# Patient Record
Sex: Female | Born: 2000 | Race: Black or African American | Hispanic: No | Marital: Single | Smoking: Never smoker
Health system: Southern US, Community
[De-identification: ages and names within clinical notes are randomized; demographics above are authoritative.]

## PROBLEM LIST (undated history)

## (undated) DIAGNOSIS — F909 Attention-deficit hyperactivity disorder, unspecified type: Secondary | ICD-10-CM

## (undated) HISTORY — PX: OTHER SURGICAL HISTORY: SHX169

---
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
## 2022-01-14 IMAGING — CR DG SACRUM/COCCYX 2+V
3 series · 3 of 3 positions shown · non-contrast
Comparison: None.

CLINICAL DATA: 19-year-old female status post fall 4 days ago with
continued tailbone pain.

EXAM:
SACRUM AND COCCYX - 2+ VIEW

[t sacrum ap]
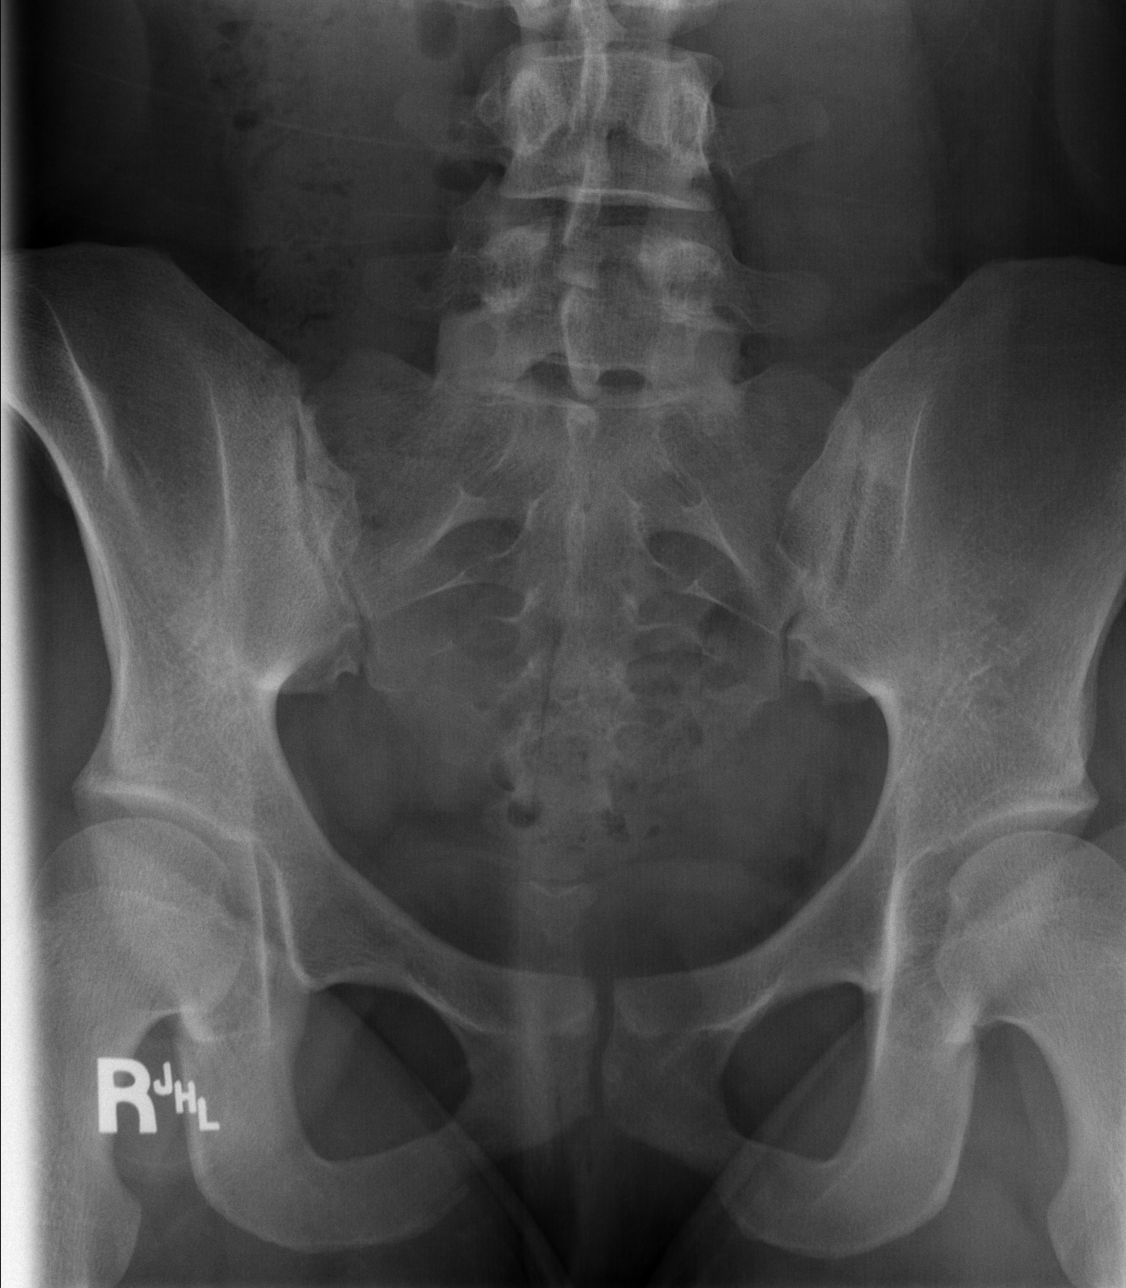

[t coccyx ap]
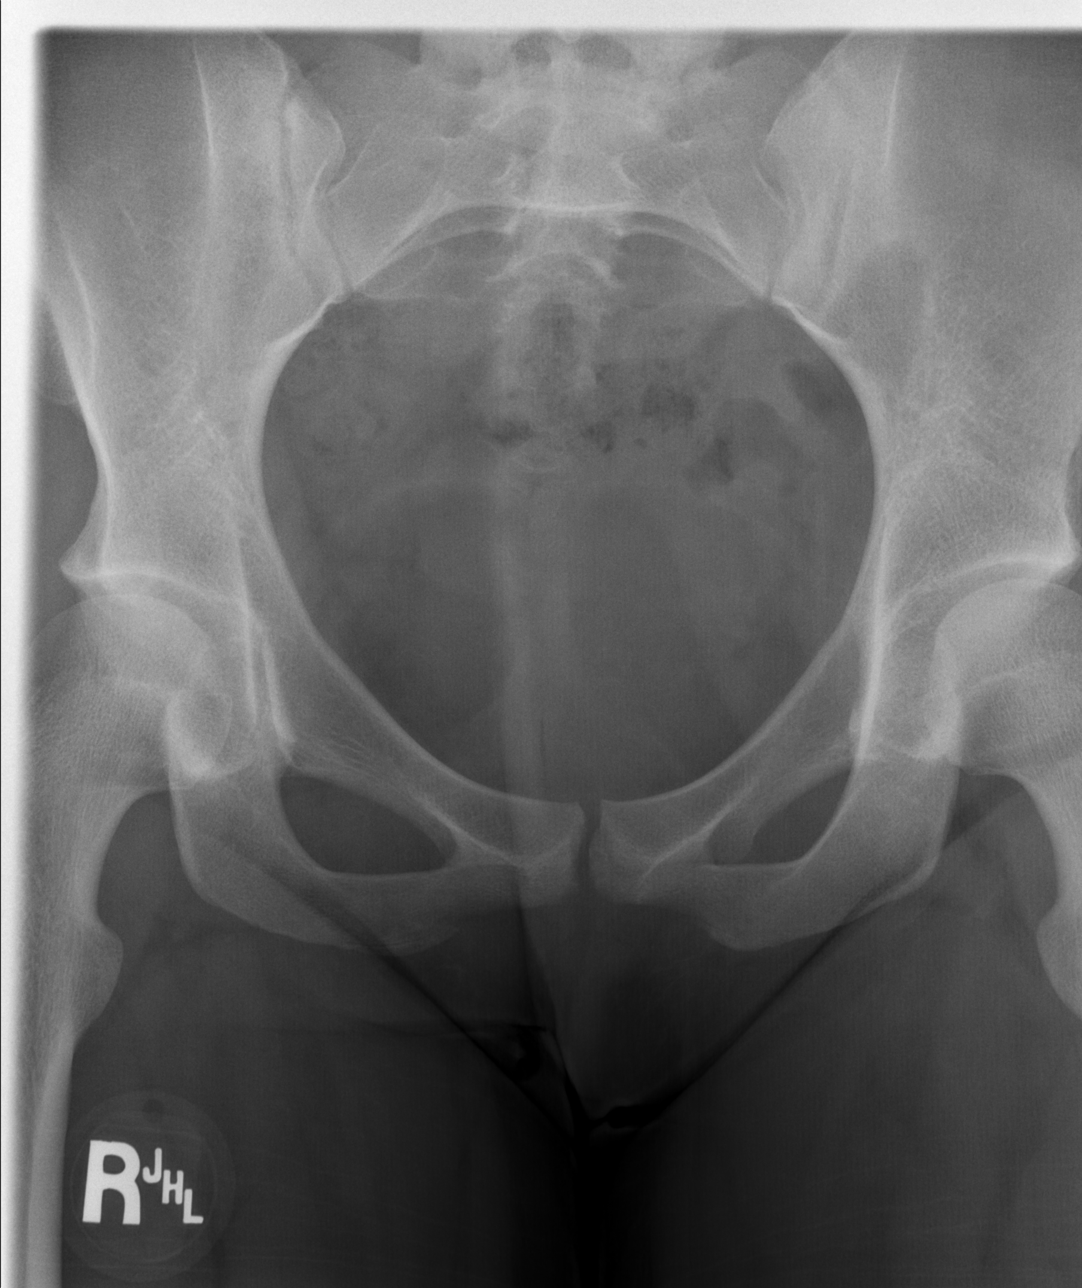

[t sacrum coccyx lat]
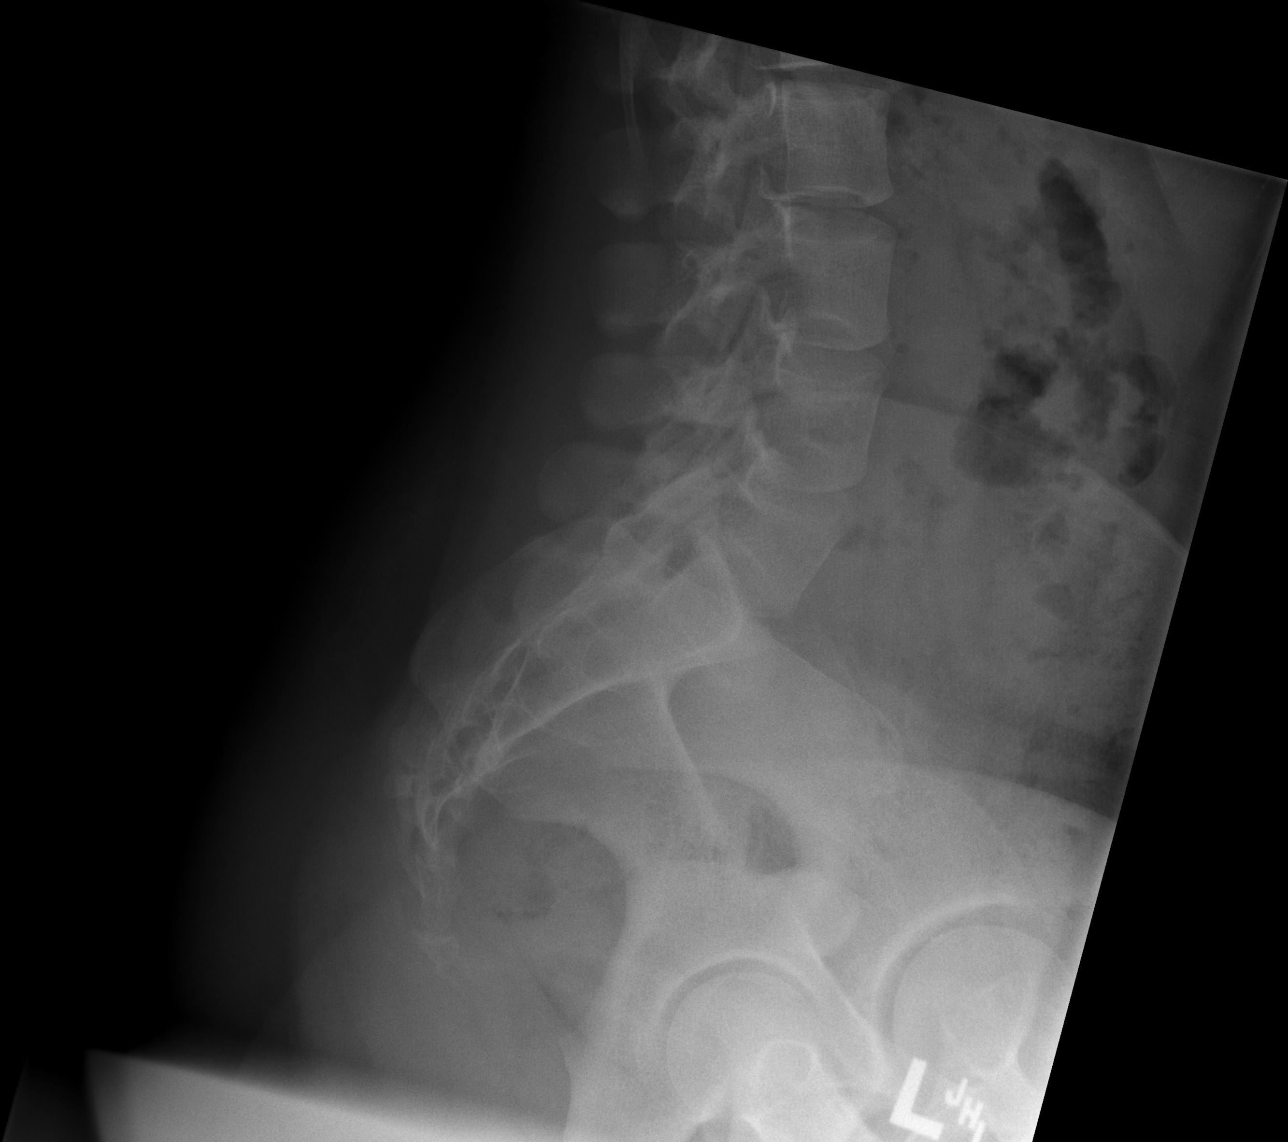

[3 of 3 positions shown; findings below may reference images not displayed]

FINDINGS: Bone mineralization is within normal limits. Sacral ala and SI
joints appear intact. Grossly negative visible lower lumbar levels.
On the lateral view lower sacral and coccygeal segments appear
within normal limits. Visible bony pelvis appears intact. Negative
visible lower abdominal and pelvic visceral contours. There is no
evidence of fracture or other focal bone lesions.
IMPRESSION: Negative.

## 2023-12-12 ENCOUNTER — Encounter (HOSPITAL_BASED_OUTPATIENT_CLINIC_OR_DEPARTMENT_OTHER): Payer: Self-pay

## 2023-12-12 ENCOUNTER — Other Ambulatory Visit: Payer: Self-pay

## 2023-12-12 ENCOUNTER — Emergency Department (HOSPITAL_BASED_OUTPATIENT_CLINIC_OR_DEPARTMENT_OTHER)
Admission: EM | Admit: 2023-12-12 | Discharge: 2023-12-13 | Disposition: A | Discharge status: Home / Self Care | Payer: Self-pay | Attending: Emergency Medicine | Admitting: Emergency Medicine

## 2023-12-12 DIAGNOSIS — Z20822 Contact with and (suspected) exposure to covid-19: Secondary | ICD-10-CM | POA: Diagnosis not present

## 2023-12-12 DIAGNOSIS — R519 Headache, unspecified: Secondary | ICD-10-CM | POA: Insufficient documentation

## 2023-12-12 DIAGNOSIS — R110 Nausea: Secondary | ICD-10-CM | POA: Insufficient documentation

## 2023-12-12 LAB — URINALYSIS, ROUTINE W REFLEX MICROSCOPIC
Bilirubin Urine: NEGATIVE
Glucose, UA: NEGATIVE mg/dL
Hgb urine dipstick: NEGATIVE
Ketones, ur: NEGATIVE mg/dL
Leukocytes,Ua: NEGATIVE
Nitrite: NEGATIVE
Protein, ur: NEGATIVE mg/dL
Specific Gravity, Urine: 1.015 (ref 1.005–1.030)
pH: 6.5 (ref 5.0–8.0)

## 2023-12-12 LAB — RESP PANEL BY RT-PCR (RSV, FLU A&B, COVID)  RVPGX2
Influenza A by PCR: NEGATIVE
Influenza B by PCR: NEGATIVE
Resp Syncytial Virus by PCR: NEGATIVE
SARS Coronavirus 2 by RT PCR: NEGATIVE

## 2023-12-12 LAB — PREGNANCY, URINE: Preg Test, Ur: NEGATIVE

## 2023-12-12 MED ORDER — ONDANSETRON HCL 4 MG/2ML IJ SOLN: 4.0000 mg | Freq: Once | INTRAMUSCULAR | Status: DC

## 2023-12-12 MED ORDER — ONDANSETRON 4 MG PO TBDP
8.0000 mg | ORAL_TABLET | Freq: Once | ORAL | Status: AC
  Administered 2023-12-13: 8 mg via ORAL
  Filled 2023-12-12: qty 2

## 2023-12-12 MED ORDER — SODIUM CHLORIDE 0.9 % IV BOLUS: 1000.0000 mL | Freq: Once | INTRAVENOUS | Status: DC

## 2023-12-12 MED ORDER — ONDANSETRON 8 MG PO TBDP: ORAL_TABLET | ORAL | 0 refills | Status: AC

## 2023-12-12 NOTE — ED Triage Notes (Signed)
 Pt reports nausea since last night and intermittent headache. Pt denies any abdominal pain. Reports LMP 1/7 with hx of irregular cycles.

## 2023-12-12 NOTE — ED Provider Notes (Signed)
 [place] [facility] Provider Note   CSN: [id number] Arrival date & time: 12/12/23  1931     History  Chief Complaint  Patient presents with   Nausea    Susan Buckley is a 23 y.o. female.  Patient is a 23 year old female presenting with complaints of nausea and lightheadedness.  This has been ongoing throughout the day.  She denies any vomiting or diarrhea.  No abdominal pain.  No fevers or chills.  No ill contacts.  No aggravating or alleviating factors.  The history is provided by the patient.       Home Medications Prior to Admission medications   Medication Sig Start Date End Date Taking? Authorizing Provider  lisdexamfetamine (VYVANSE) 20 MG capsule Take 20 mg by mouth daily.    [provider]  meloxicam  (MOBIC ) 7.5 MG tablet Take 1 tablet (7.5 mg total) by mouth daily. Do not take w/motrin alleve or excedrin 11/25/16   Sharren Decree, MD  orphenadrine  (NORFLEX ) 100 MG tablet Take 1 tablet (100 mg total) by mouth at bedtime. 11/25/16   Sharren Decree, MD      Allergies    Patient has no known allergies.    Review of Systems   Review of Systems  All other systems reviewed and are negative.   Physical Exam Updated Vital Signs BP (!) 141/79   Pulse 98   Temp 98.9 F (37.2 C)   Resp 15   Ht 5\' 4"  (1.626 m)   Wt 105.2 kg   LMP 11/08/2023 (Exact Date)   SpO2 99%   BMI 39.82 kg/m  Physical Exam Vitals and nursing note reviewed.  Constitutional:      General: She is not in acute distress.    Appearance: She is well-developed. She is not diaphoretic.  HENT:     Head: Normocephalic and atraumatic.  Cardiovascular:     Rate and Rhythm: Normal rate and regular rhythm.     Heart sounds: No murmur heard.    No friction rub. No gallop.  Pulmonary:     Effort: Pulmonary effort is normal. No respiratory distress.     Breath sounds: Normal breath sounds. No wheezing.  Abdominal:     General: Bowel sounds are normal.  There is no distension.     Palpations: Abdomen is soft.     Tenderness: There is no abdominal tenderness.  Musculoskeletal:        General: Normal range of motion.     Cervical back: Normal range of motion and neck supple.  Skin:    General: Skin is warm and dry.  Neurological:     General: No focal deficit present.     Mental Status: She is alert and oriented to person, place, and time.     ED Results / Procedures / Treatments   Labs (all labs ordered are listed, but only abnormal results are displayed) Labs Reviewed  RESP PANEL BY RT-PCR (RSV, FLU A&B, COVID)  [id number]  URINALYSIS, ROUTINE W REFLEX MICROSCOPIC  PREGNANCY, URINE  COMPREHENSIVE METABOLIC PANEL  LIPASE, BLOOD  CBC WITH DIFFERENTIAL/PLATELET    EKG None  Radiology No results found.  Procedures Procedures    Medications Ordered in ED Medications  sodium chloride  0.9 % bolus 1,000 mL (has no administration in time range)  ondansetron  (ZOFRAN ) injection 4 mg (has no administration in time range)    ED Course/ Medical Decision Making/ A&P  Patient presenting with complaints of nausea and lightheadedness occurring intermittently  throughout the day.  Patient arrives here with stable vital signs and is afebrile.  Physical examination is unremarkable.  Urinalysis obtained in triage negative for UTI and pregnancy test negative.  Respiratory panel also obtained and is negative for COVID/flu/RSV.  Plans were to obtain laboratory studies and give IV hydration and medications.  When the nurse entered the room to do this, patient informed him that she would have been here for several hours and needed to go home.  She requested to be discharged.  She will be given ODT Zofran  and advised to return as needed if symptoms worsen or change.  Final Clinical Impression(s) / ED Diagnoses Final diagnoses:  None    Rx / DC Orders ED Discharge Orders     None         Orvilla Blander, MD 12/12/23 2357

## 2023-12-12 NOTE — Discharge Instructions (Signed)
 Begin taking Zofran  as prescribed as needed for nausea.  Clear liquids for the next 12 hours, then slowly advance diet as tolerated.  Return to the emergency department if you develop severe abdominal pain, worsening nausea, high fevers, or for other new and concerning symptoms.

## 2023-12-12 NOTE — ED Notes (Signed)
 Pt denies headache at this time, reports improvement in nausea though it is still present.
